# Patient Record
Sex: Female | Born: 1980 | Race: Black or African American | Hispanic: No | Marital: Single | Smoking: Current some day smoker
Health system: Southern US, Community
[De-identification: ages and names within clinical notes are randomized; demographics above are authoritative.]

## PROBLEM LIST (undated history)

## (undated) DIAGNOSIS — R011 Cardiac murmur, unspecified: Secondary | ICD-10-CM

## (undated) DIAGNOSIS — E039 Hypothyroidism, unspecified: Secondary | ICD-10-CM

## (undated) DIAGNOSIS — G5621 Lesion of ulnar nerve, right upper limb: Secondary | ICD-10-CM

## (undated) HISTORY — PX: WISDOM TOOTH EXTRACTION: SHX21

---

## 2000-02-22 ENCOUNTER — Other Ambulatory Visit: Admission: RE | Admit: 2000-02-22 | Discharge: 2000-02-22 | Payer: Self-pay | Admitting: Obstetrics and Gynecology

## 2000-03-07 ENCOUNTER — Inpatient Hospital Stay: Admission: AD | Admit: 2000-03-07 | Discharge: 2000-03-07 | Payer: Self-pay | Admitting: Obstetrics and Gynecology

## 2000-06-13 ENCOUNTER — Inpatient Hospital Stay (HOSPITAL_COMMUNITY): Admission: AD | Admit: 2000-06-13 | Discharge: 2000-07-22 | Payer: Self-pay | Admitting: Obstetrics & Gynecology

## 2000-06-13 ENCOUNTER — Encounter (INDEPENDENT_AMBULATORY_CARE_PROVIDER_SITE_OTHER): Payer: Self-pay | Admitting: Specialist

## 2000-06-14 ENCOUNTER — Encounter: Payer: Self-pay | Admitting: Obstetrics and Gynecology

## 2000-06-27 ENCOUNTER — Encounter: Payer: Self-pay | Admitting: Obstetrics and Gynecology

## 2001-10-16 ENCOUNTER — Encounter: Payer: Self-pay | Admitting: Emergency Medicine

## 2001-10-16 ENCOUNTER — Emergency Department (HOSPITAL_COMMUNITY): Admission: EM | Admit: 2001-10-16 | Discharge: 2001-10-16 | Payer: Self-pay | Admitting: Emergency Medicine

## 2004-01-02 ENCOUNTER — Inpatient Hospital Stay (HOSPITAL_COMMUNITY): Admission: AD | Admit: 2004-01-02 | Discharge: 2004-01-02 | Payer: Self-pay

## 2004-01-15 ENCOUNTER — Encounter (HOSPITAL_COMMUNITY): Admission: RE | Admit: 2004-01-15 | Discharge: 2004-04-14 | Payer: Self-pay | Admitting: Internal Medicine

## 2004-03-13 ENCOUNTER — Ambulatory Visit (HOSPITAL_COMMUNITY): Admission: RE | Admit: 2004-03-13 | Discharge: 2004-03-13 | Payer: Self-pay | Admitting: Internal Medicine

## 2004-03-20 ENCOUNTER — Encounter (INDEPENDENT_AMBULATORY_CARE_PROVIDER_SITE_OTHER): Payer: Self-pay | Admitting: Specialist

## 2004-03-20 ENCOUNTER — Ambulatory Visit (HOSPITAL_COMMUNITY): Admission: RE | Admit: 2004-03-20 | Discharge: 2004-03-20 | Payer: Self-pay | Admitting: Internal Medicine

## 2004-05-16 ENCOUNTER — Emergency Department (HOSPITAL_COMMUNITY): Admission: EM | Admit: 2004-05-16 | Discharge: 2004-05-16 | Payer: Self-pay | Admitting: Emergency Medicine

## 2005-12-24 ENCOUNTER — Emergency Department (HOSPITAL_COMMUNITY): Admission: EM | Admit: 2005-12-24 | Discharge: 2005-12-24 | Payer: Self-pay | Admitting: Emergency Medicine

## 2006-02-16 IMAGING — US US SOFT TISSUE HEAD/NECK
1 series · 13 of 25 positions shown · non-contrast
Comparison: none

CLINICAL DATA: Lt thyroid nodule; this has been demonstrated to represent a cold nodule on thyroid scintigraphy dated 01/16/04; ultrasound is now performed
 ULTRASOUND OF THE SOFT TISSUES OF THE NECK:
 Examination of the thyroid gland was performed.  The left lobe is relatively enlarged compared to the right and measures 5.4 x 2.3 x 3.1 cm.  The right lobe measures 4.1 x 1.3 x 0.9 cm.  Thyroid isthmus is of normal thickness in the midline measuring 1.6 mm.  
 There is a dominant, ovoid nodule within the mid to lower portion of the left lobe measuring 3.3 x 1.5 x 2.4 cm.  This corresponds to cold defect by ultrasound.  Just inferior to this is a second small nodule at the tip of the inferior lobe measuring 1.4 x 0.9 x 1.6 cm.  The upper pole of the left lobe demonstrates a focal benign appearing cyst measuring 6 x 3 x 5 mm.  
 Right lobe demonstrates two small areas of nodularity which are partially cystic.  There is a tiny 4 x 3 x 3 mm nodule in the upper pole and a 10 x 7 x 7 mm nodule in the mid portion of the right lobe.

[Series 1: unknown · 0.07mm/px · 13 of 55 slices shown]
[im 1/55]
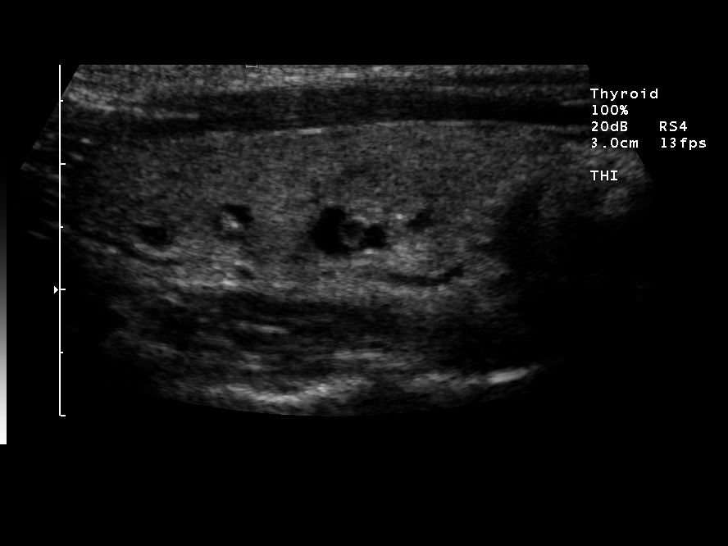
[im 5/55]
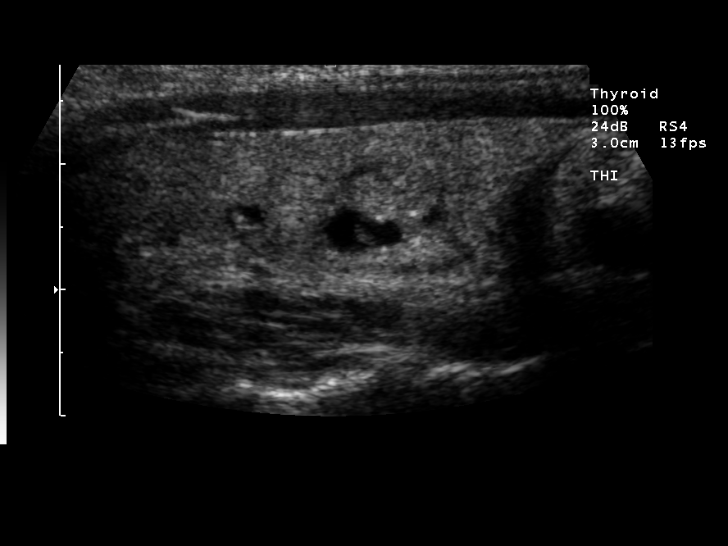
[im 10/55]
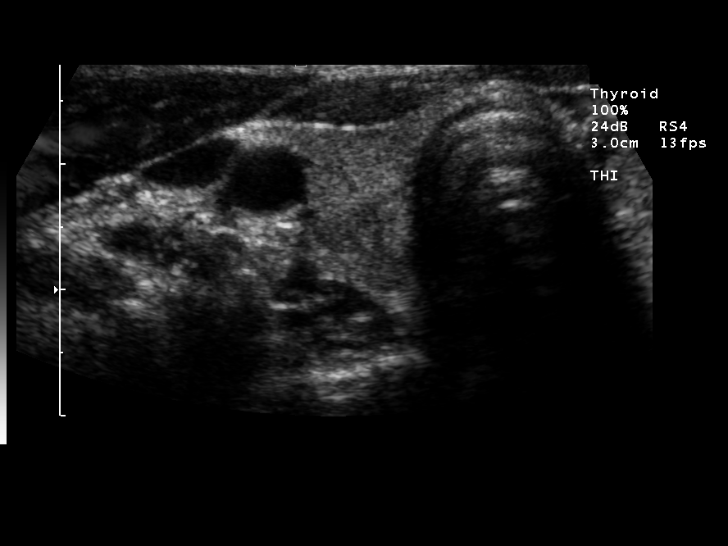
[im 14/55]
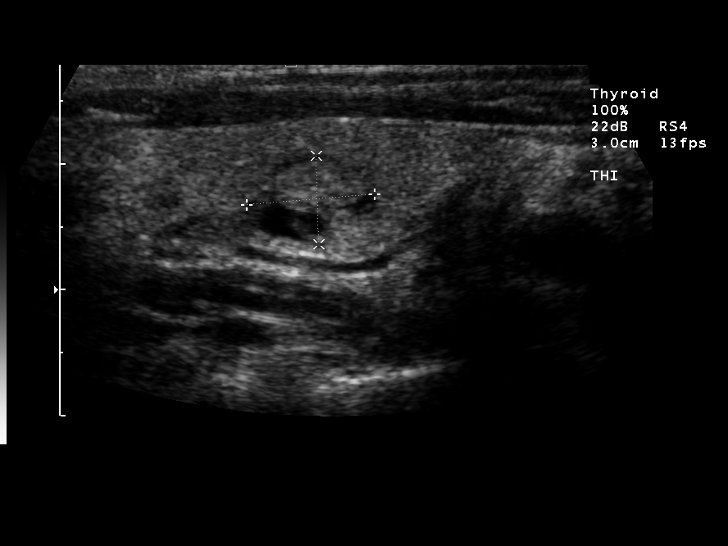
[im 19/55]
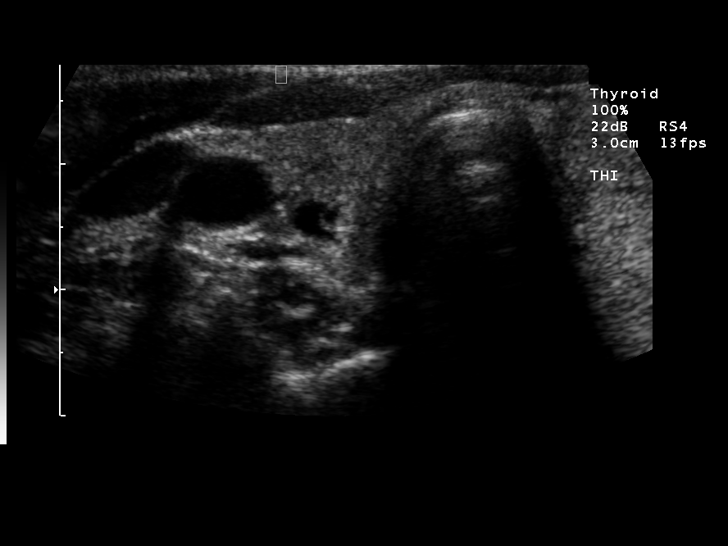
[im 23/55]
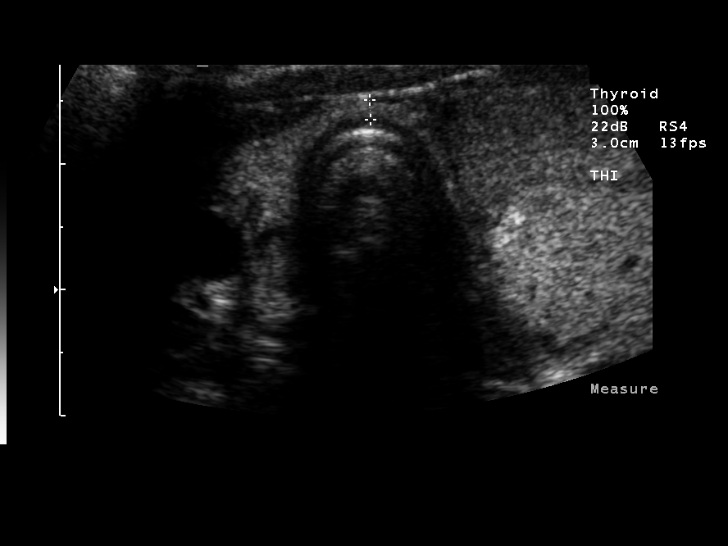
[im 28/55]
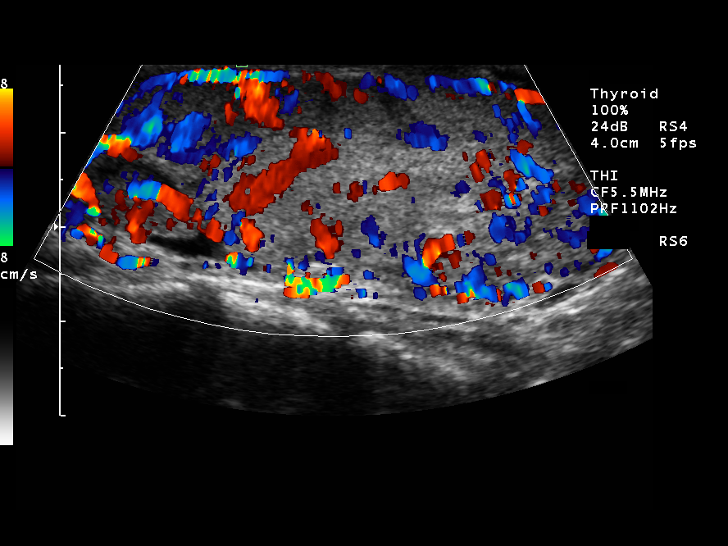
[im 32/55]
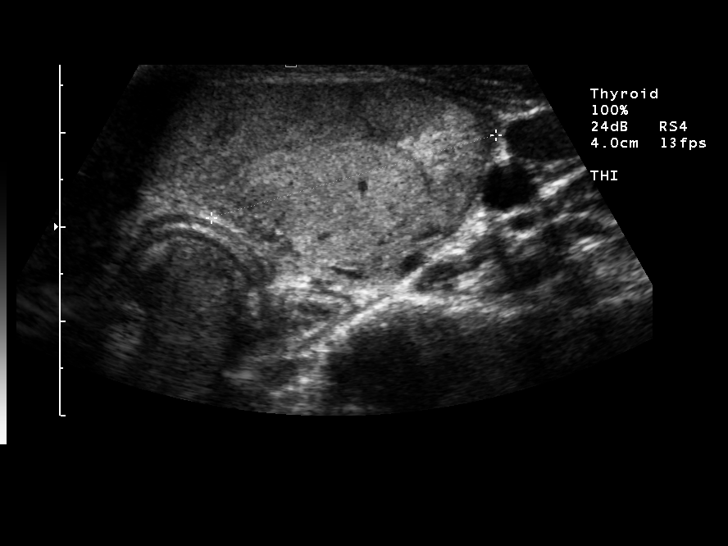
[im 37/55]
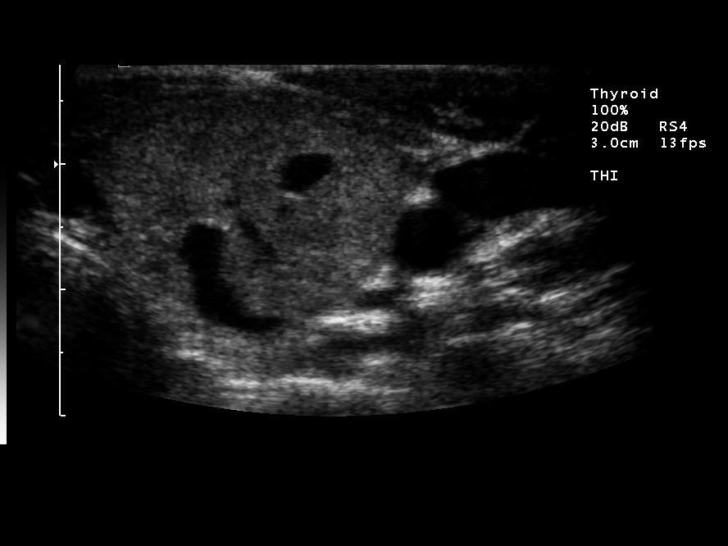
[im 41/55]
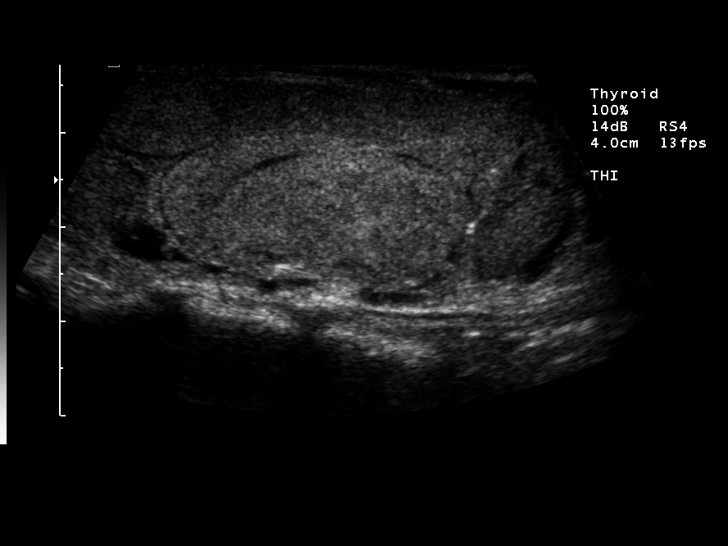
[im 46/55]
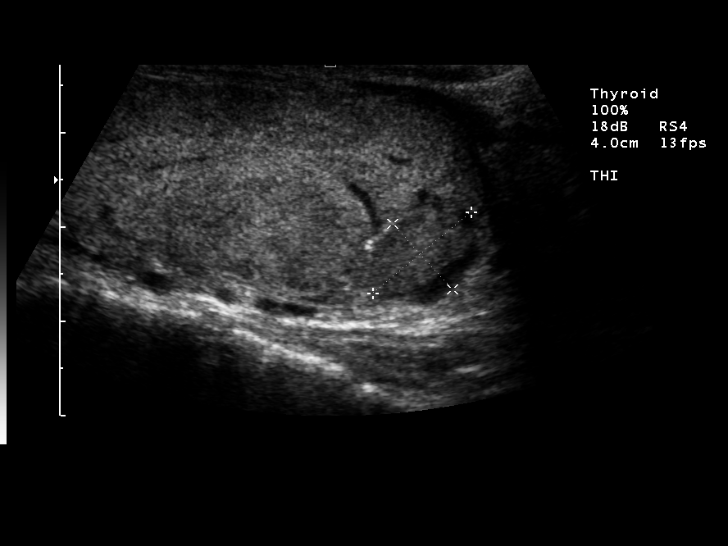
[im 50/55]
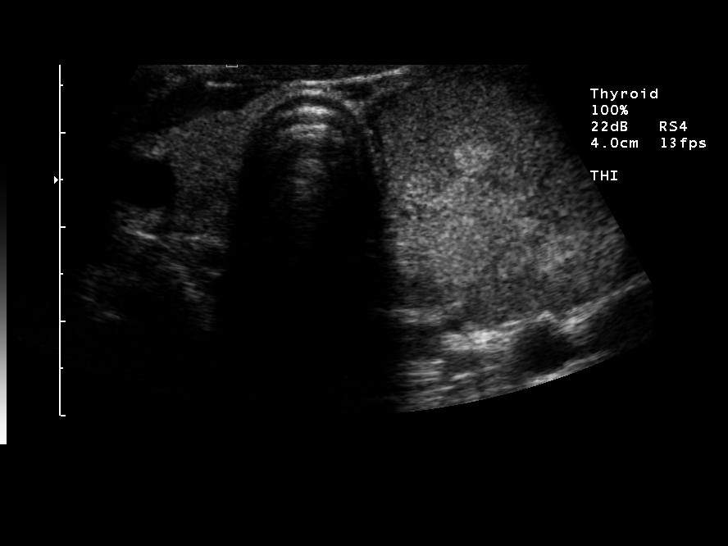
[im 55/55]
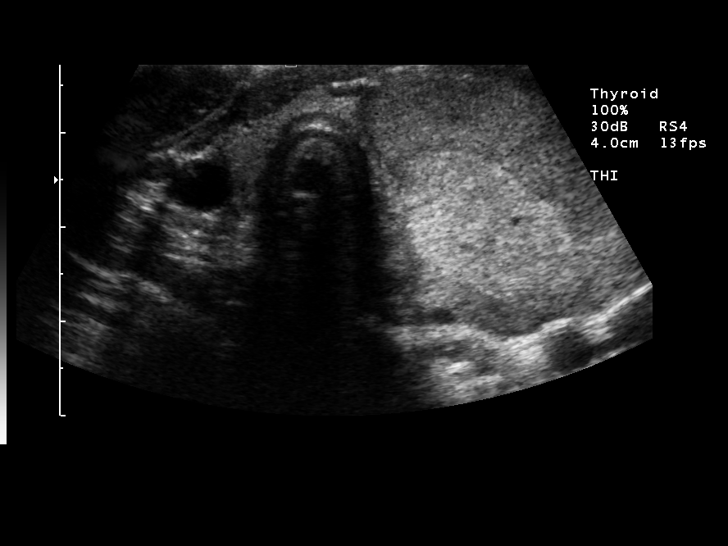

[13 of 25 positions shown; findings below may reference images not displayed]

IMPRESSION: Bilateral thyroid nodules as described.  The dominant nodule within the mid to inferior thyroid measures 3.3 x 1.5 x 2.4 cm and corresponds to the cold defect seen on nuclear medicine study.  Given slightly increased chance of malignancy with a cold defect, ultrasound guided biopsy of this nodule may be warranted.

## 2006-12-06 ENCOUNTER — Encounter (INDEPENDENT_AMBULATORY_CARE_PROVIDER_SITE_OTHER): Payer: Self-pay | Admitting: Interventional Radiology

## 2006-12-06 ENCOUNTER — Other Ambulatory Visit: Admission: RE | Admit: 2006-12-06 | Discharge: 2006-12-06 | Payer: Self-pay | Admitting: Physician Assistant

## 2006-12-06 ENCOUNTER — Encounter: Admission: RE | Admit: 2006-12-06 | Discharge: 2006-12-06 | Payer: Self-pay | Admitting: Internal Medicine

## 2007-12-07 ENCOUNTER — Encounter: Admission: RE | Admit: 2007-12-07 | Discharge: 2007-12-07 | Payer: Self-pay | Admitting: Internal Medicine

## 2007-12-11 ENCOUNTER — Encounter: Admission: RE | Admit: 2007-12-11 | Discharge: 2008-01-04 | Payer: Self-pay | Admitting: Orthopedic Surgery

## 2008-01-31 ENCOUNTER — Encounter (HOSPITAL_BASED_OUTPATIENT_CLINIC_OR_DEPARTMENT_OTHER): Payer: Self-pay | Admitting: General Surgery

## 2008-01-31 ENCOUNTER — Ambulatory Visit (HOSPITAL_COMMUNITY): Admission: RE | Admit: 2008-01-31 | Discharge: 2008-02-01 | Payer: Self-pay | Admitting: General Surgery

## 2008-01-31 HISTORY — PX: TOTAL THYROIDECTOMY: SHX2547

## 2010-06-09 ENCOUNTER — Emergency Department (HOSPITAL_COMMUNITY)
Admission: EM | Admit: 2010-06-09 | Discharge: 2010-06-09 | Disposition: A | Discharge status: Home / Self Care | Payer: BC Managed Care – PPO | Attending: Emergency Medicine | Admitting: Emergency Medicine

## 2010-06-09 ENCOUNTER — Emergency Department (HOSPITAL_COMMUNITY): Payer: BC Managed Care – PPO

## 2010-06-09 DIAGNOSIS — R51 Headache: Secondary | ICD-10-CM | POA: Insufficient documentation

## 2010-06-09 DIAGNOSIS — F3289 Other specified depressive episodes: Secondary | ICD-10-CM | POA: Insufficient documentation

## 2010-06-09 DIAGNOSIS — E039 Hypothyroidism, unspecified: Secondary | ICD-10-CM | POA: Insufficient documentation

## 2010-06-09 DIAGNOSIS — H532 Diplopia: Secondary | ICD-10-CM | POA: Insufficient documentation

## 2010-06-09 DIAGNOSIS — F329 Major depressive disorder, single episode, unspecified: Secondary | ICD-10-CM | POA: Insufficient documentation

## 2010-06-09 LAB — COMPREHENSIVE METABOLIC PANEL
AST: 19 U/L (ref 0–37)
BUN: 11 mg/dL (ref 6–23)
CO2: 26 mEq/L (ref 19–32)
Chloride: 101 mEq/L (ref 96–112)
Creatinine, Ser: 0.81 mg/dL (ref 0.4–1.2)
GFR calc Af Amer: >60 mL/min (ref >60)
GFR calc non Af Amer: >60 mL/min (ref >60)
Total Bilirubin: 0.8 mg/dL (ref 0.3–1.2)

## 2010-06-09 LAB — DIFFERENTIAL
Basophils Absolute: 0 10*3/uL (ref 0.0–0.1)
Basophils Relative: 0 % (ref 0–1)
Eosinophils Relative: 2 % (ref 0–5)
Lymphocytes Relative: 25 % (ref 12–46)

## 2010-06-09 LAB — CBC
HCT: 39.4 % (ref 36.0–46.0)
Platelets: 274 10*3/uL (ref 150–400)
RDW: 12.9 % (ref 11.5–15.5)
WBC: 9.2 10*3/uL (ref 4.0–10.5)

## 2010-06-09 LAB — T4: T4, Total: 6.2 ug/dL (ref 5.0–12.5)

## 2010-06-09 LAB — TSH: TSH: 34.509 u[IU]/mL — ABNORMAL HIGH (ref 0.350–4.500)

## 2010-08-11 NOTE — Op Note (Signed)
NAMEAROURA, VASUDEVAN NO.:  [id number]   MEDICAL RECORD NO.:  [id number]          PATIENT TYPE:  OIB   LOCATION:  5121                         FACILITY:  [facility]   PHYSICIAN:  Leonie Man, M.D.   DATE OF BIRTH:  10-Sep-1980   DATE OF PROCEDURE:  01/31/2008  DATE OF DISCHARGE:  02/01/2008                               OPERATIVE REPORT   PREOPERATIVE DIAGNOSIS:  Multinodular goiter with compression symptoms.   POSTOPERATIVE DIAGNOSIS:  Multinodular goiter with compression symptoms.   PROCEDURE:  Total thyroidectomy.   SURGEON:  Leonie Man, MD   ASSISTANT:  Currie Paris, MD   ANESTHESIA:  General.   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  She does have  some breathing problems from time to time.  She was placed on  suppression therapy with 50 mcg of Synthroid, but has been unable to  tolerate this due to symptoms of anxiety and irritability and  significant mood changes and palpitations.  She has discontinued the  suppression therapy and is referred for surgical treatment of her  thyroid problem.   The risks and potential benefits of thyroid surgery have been fully  discussed with the patient.  She understands the additional risks of  permanent hypoparathyroidism and recurrent laryngeal nerve injury.  She  understands and gives her consent to surgery.   PROCEDURE:  The patient was positioned supinely.  Following the  induction of general endotracheal anesthesia, the head and neck are  hyperextended with a wedge placed between the shoulders.  The neck and  anterior chest were prepped and draped to be included in the sterile  operative field.  Standard presurgical precautions identifying the  patient as Dermabond.  Procedure to be done as total thyroidectomy.  Appropriate preoperative antibiotics  given.  No heavy problems noted.   A transverse collar incision made approximately two fingerbreadths above  the sternal notch, deepened through the skin and subcutaneous tissues  and across the platysma muscle.  A superior myocutaneous flap was raised  up to the thyroid cartilage and an inferior myocutaneous flap taken down  to the sternal notch.  The strap muscles are then opened up in the  midline carrying the dissection of thyroid gland first over to the left  side dissecting up to the superior pole where the superior pole vessels  were isolated, doubly clipped and transected with a harmonic scalpel.  The left upper pole parathyroid was identified and protected throughout  the course of dissection and care was taken to avoid the recurrent  laryngeal nerve on this side, which was also identified.  The lower pole  parathyroid was also identified.  This inferior thyroid vessels were  taken between the clips and transected.  Thyroid gland was then  dissected away from the trachea carrying this across to midline and  taking with it the pyramidal lobe.  The left sulcus of the neck was then  checked for hemostasis and packed  with a sponge while attention was then  turned to the right side of the neck.  Similarly, the sternohyoid and  sternothyroid muscles were dissected free from off of the thyroid gland,  carrying the dissection laterally over to the right side.  The middle  thyroid vein was taken between the clips and the thyroid mobilized  carrying the dissection up to the superior pole where the superior  vessels were isolated and doubly clipped and transected with a harmonic  scalpel.  With the use of the harmonic scalpel, the thigh was then  dissected free.  The superior and inferior parathyroid glands as well as  the recurrent laryngeal nerves were protected throughout the course of  dissection.  The thyroid gland and the inferior pole vessels were taken  between the clips and  transected with a harmonic scalpel.  The remaining  attachments of the ligament of Allyson Sabal were then taken down removing the  thyroid in its entirety and forwarding this for pathologic evaluation.  All areas within both the right and left neck sulcus were checked for  hemostasis and additional bleeding points treated with either small  clips or with electrocautery.  The wound was thoroughly irrigated with  normal.  With normal saline.  Surgicel pads were placed over all areas  of dissection for additional hemostasis.  Sponge, instrument, and sharp  counts were then verified.  The midline strap muscles were closed with  interrupted 3-0 Vicryl sutures.  The flaps were checked for hemostasis,  noted to be dry.  The platysma muscles were closed with interrupted 3-0  Vicryl sutures.  The skin was closed with a 5-0 Monocryl suture and  reinforced with Steri-Strips.  Sterile dressing was placed on the wound.  The anesthetic reversed and the patient removed from the operating room  to the recovery room in stable condition.  She tolerated the procedure  well.      Leonie Man, M.D.  Electronically Signed     PB/MEDQ  D:  02/13/2008  T:  02/13/2008  Job:  [id number]   cc:   Dorisann Frames, M.D.  [facility]

## 2010-08-14 NOTE — Op Note (Signed)
[facility]  Patient:    Jeanne Schmidt, Jeanne Schmidt                           MRN: [id number] Proc. Date: 07/16/00 Adm. Date:  [id number] Attending:  Marcelle Overlie                           Operative Report  PATIENTS AGE:                30.  PREOPERATIVE DIAGNOSES:       1. A 34 to 35 week intrauterine pregnancy                               2. Prolonged premature rupture of membranes.                               3. Failed induction.                               4. Nonreassuring fetal heart tracing.  POSTOPERATIVE DIAGNOSES:      1. A 34 to 35 week intrauterine pregnancy                               2. Prolonged premature rupture of membranes.                               3. Failed induction.                               4. Nonreassuring fetal heart tracing.                               5. Delivery of a 5 pound 12 ounce female                                  infant with Apgars 8 and 9.  OPERATION:                    Primary low transverse cesarean section.  SURGEON:                      Gerrit Friends. Aldona Bar, M.D.  ASSISTANT:  ANESTHESIA:                   Epidural anesthesia.  ESTIMATED BLOOD LOSS:         500 cc.  INDICATIONS:                  This 30 year old primigravida was admitted on June 13, 2000, at almost [redacted] weeks gestation with preterm premature rupture of membranes.  She was treated very aggressively initially with very close observation afterwards and did amazingly well.  On the evening of July 14, 2000, [name]. Dareen Piano, M.D., collected a vaginal pool of amniotic fluid and submitted it to the lab for Amniostat which was markedly positive. Induction therefore was begun on the  morning of July 15, 2000.  Her cervix at that time was 3 cm dilated, 80% effaced with vertex at +1 station.  She was maintained on Pitocin during the entire day of July 15, 2000, with very little change to her cervix.  An intrauterine pressure catheter had  been placed and a good contraction pattern was noted.  At approximately 8 or 9 p.m. on the evening of July 15, 2000, the Pitocin was decreased but continuously maintained through the night.  It was increased again on the morning of July 16, 2000, and an epidural was placed as well.  At that time she had progressed to 4 cm and almost 100% effaced.  Unfortunately, although some change in the cervix was noted initially, there seemed to be increased thickening over time with no further change beyond 4 cm in spite of great labor as documented by her intrauterine pressure catheter.  Indeed, another worrisome factor was the beginning of a flatter appearing fetal heart tracing. The decision was made to proceed to delivery by low transverse cesarean section.  DESCRIPTION OF PROCEDURE:     The patient was taken to the operating room where her epidural was augmented.  She was placed on the operating table, slightly tilted to the left in the supine position.  A Foley catheter had already been inserted.  Once her epidural was augmented, she was prepped and draped adequately.  Anesthetic levels were checked and found to be adequate. At this time the procedure was begun.  A Pfannenstiel incision was made with minimal difficulty, dissected down sharply to and through the fascia in low transverse fashion without difficulty.  Subfascial space was created inferiorly and superiorly and muscles separated in the midline.  Peritoneum identified and entered appropriately, with care taken to avoid the bowel superiorly and the bladder inferiorly.  At this time the vesicouterine peritoneum was identified, incised in low transverse fashion and pushed off the lower uterine segment with ease.  Sharp incision into the uterus with Metzenbaum scissors was then carried out in the low transverse fashion and extended with the fingers. Amniotic fluid appeared and was clear.  Thereafter, with minimal difficulty, a viable  female infant was delivered.  There was a nuchal cord x 1.  After the infant was delivered from the vertex position, the cord was clamped and cut and the infant was passed off to the awaiting team. The baby was subsequently taken to the nursery in good condition with Apgars of 8 and 9 and was found to weigh 5 pounds 12 ounces.  It was a female.  The baby seemed to have a dislocated left knee, probably related to in utero positioning associated with oligohydramnios - preterm premature rupture of membranes.  After the placenta was delivered intact, after the cord bloods were collected, the uterus was exteriorized. The uterus was rendered free of any remaining products of conception.  Good uterine contractility was afforded with slowly given intravenous Pitocin and manual stimulation.  Closure of the uterus was then carried out using a single layer of #1 Vicryl in a running locking fashion with one reinforced #1 Vicryl in a left angle. At this time hemostasis was adequate in the uterine incision. Tubes and ovaries appeared normal. The uterus was well contracted.  The uterus was then replaced into the abdomen after the abdomen was washed of all blood and clot.  Closure of the abdomen at this time was begun after the abdomen was noted to be free of any foreign bodies or  instruments, all counts were correct. The abdominal peritoneum was closed with 0 Vicryl in a running fashion and muscles secured with same. Assured of good subfascial hemostasis, the fascia was then reapproximated with 0 Vicryl from angle to midline bilaterally. Subcutaneous tissue was then rendered hemostatic and staples were then used to close the skin.  A sterile pressure dressing was applied and the patient, at this time, was transported to the recovery room in satisfactory condition having tolerated the procedure well.  The patient had good urine output during the procedure.  At the conclusion of the procedure both she and  the baby were doing well in their respective recovery areas.  The estimated blood loss was 500 cc.  All counts were correct x 2. DD:  07/16/00 TD:  07/18/00 Job: [id number]

## 2010-08-14 NOTE — Discharge Summary (Signed)
[facility]  Patient:    Jeanne Schmidt, Jeanne Schmidt                           MRN: [id number] Adm. Date:  [id number] Disc. Date: 07/22/00 Attending:  Marcelle Overlie                           Discharge Summary  FINAL DIAGNOSES:              Preterm premature rupture of membranes, delivery of healthy female infant, failed induction.  SECONDARY DIAGNOSES:          None.  PROCEDURE:                    Primary low transverse cesarean section.  COMPLICATIONS:                None.  CONDITION ON DISCHARGE:       Improved.                                This is a 30 year old gravida 2, para 0 who was admitted at [redacted] weeks gestation with preterm premature rupture of membranes. Patient was admitted to the hospital and started on magnesium.  Was given betamethasone to mature the fetal lungs and Unasyn to prevent chorioamnionitis.  Patient was observed in the hospital and over the course of a week she did not go into preterm labor.  Her vital signs remained stable with no evidence of intrauterine infection.  She was maintained on oral antibiotics and continued at hospital observation.  The patient had absolutely no complications in the hospital and no signs of developing chorioamnionitis or preterm labor.  When the patient reached the [redacted] weeks gestation the decision was made to collect amniotic fluid from the vagina and check for fetal lung maturity.  This was done on April 18 and it came back positive for PG which indicated fetal lung maturity.  Based upon this and the risks of further observation of cord prolapse or chorioamnionitis, the decision was made to proceed with induction.  Unfortunately, the induction did not go smoothly and the patient developed some nonreassuring changes of the heart rate pattern.  For this reason the obstetrician on-call, Dr. Annamaria Helling, made the decision to proceed with low transverse cesarean section which was done on April 20.  The  cesarean went without complications with the delivery of a 5 pound 12 ounce female infant with Apgar scores of 8 and 9.  Postoperatively the patient developed a low grade infection which was treated with gentamycin and clindamycin.  On the morning of the fifth postoperative day the patient had been afebrile for 24 hours.  Her pulse was 77.  White count was 7800.  She was therefore felt to be ready for discharge.  She was also ambulating well, voicing no complaints, voiding, eating, and passing gas without difficulty. She was therefore discharged on a regular diet, told to limit her activity. She was given Tylox 20 tablets to take one to two q.4h. for pain.  Was asked to start out with over-the-counter medications and take the Tylox only if the over-the-counter medications failed to relieve her discomfort.  In addition, she was given Augmentin 875 mg to take b.i.d. for an additional three days and asked to return to the office in four weeks  for followup evaluation.  LABORATORIES:                 White count 7800 on discharge, hemoglobin 7.4. DD:  07/22/00 TD:  07/22/00 Job: [id number]

## 2010-12-29 LAB — COMPREHENSIVE METABOLIC PANEL
ALT: 14
Chloride: 101
GFR calc Af Amer: >60
GFR calc non Af Amer: >60
Potassium: 3.6
Sodium: 138
Total Protein: 7.9

## 2010-12-29 LAB — PROTIME-INR
INR: 1
Prothrombin Time: 13.7

## 2010-12-29 LAB — DIFFERENTIAL
Eosinophils Absolute: 0.1
Lymphocytes Relative: 29
Lymphs Abs: 2.4
Neutro Abs: 5.3
Neutrophils Relative %: 63

## 2010-12-29 LAB — CBC
HCT: 36.7
Platelets: 294
WBC: 8.4

## 2010-12-29 LAB — CALCIUM: Calcium: 8.5

## 2012-07-17 ENCOUNTER — Other Ambulatory Visit: Payer: Self-pay | Admitting: Registered Nurse

## 2012-07-17 ENCOUNTER — Other Ambulatory Visit (HOSPITAL_COMMUNITY)
Admission: RE | Admit: 2012-07-17 | Discharge: 2012-07-17 | Disposition: A | Discharge status: Home / Self Care | Payer: BC Managed Care – PPO | Source: Ambulatory Visit | Attending: Internal Medicine | Admitting: Internal Medicine

## 2012-07-17 DIAGNOSIS — Z01419 Encounter for gynecological examination (general) (routine) without abnormal findings: Secondary | ICD-10-CM | POA: Insufficient documentation

## 2014-04-27 ENCOUNTER — Encounter (HOSPITAL_COMMUNITY): Payer: Self-pay | Admitting: Emergency Medicine

## 2014-04-27 ENCOUNTER — Emergency Department (HOSPITAL_COMMUNITY)
Admission: EM | Admit: 2014-04-27 | Discharge: 2014-04-27 | Disposition: A | Discharge status: Home / Self Care | Payer: BC Managed Care – PPO | Attending: Emergency Medicine | Admitting: Emergency Medicine

## 2014-04-27 DIAGNOSIS — S199XXA Unspecified injury of neck, initial encounter: Secondary | ICD-10-CM | POA: Insufficient documentation

## 2014-04-27 DIAGNOSIS — Y998 Other external cause status: Secondary | ICD-10-CM | POA: Diagnosis not present

## 2014-04-27 DIAGNOSIS — Y92410 Unspecified street and highway as the place of occurrence of the external cause: Secondary | ICD-10-CM | POA: Diagnosis not present

## 2014-04-27 DIAGNOSIS — Y9389 Activity, other specified: Secondary | ICD-10-CM | POA: Diagnosis not present

## 2014-04-27 DIAGNOSIS — S3992XA Unspecified injury of lower back, initial encounter: Secondary | ICD-10-CM | POA: Insufficient documentation

## 2014-04-27 DIAGNOSIS — Z720 Tobacco use: Secondary | ICD-10-CM | POA: Insufficient documentation
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
## 2014-04-27 NOTE — Discharge Instructions (Signed)
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
## 2015-05-10 ENCOUNTER — Emergency Department (HOSPITAL_COMMUNITY): Payer: BC Managed Care – PPO

## 2015-05-10 ENCOUNTER — Encounter (HOSPITAL_COMMUNITY): Payer: Self-pay | Admitting: Emergency Medicine

## 2015-05-10 ENCOUNTER — Emergency Department (HOSPITAL_COMMUNITY)
Admission: EM | Admit: 2015-05-10 | Discharge: 2015-05-10 | Disposition: A | Discharge status: Home / Self Care | Payer: BC Managed Care – PPO | Attending: Emergency Medicine | Admitting: Emergency Medicine

## 2015-05-10 DIAGNOSIS — Y9389 Activity, other specified: Secondary | ICD-10-CM | POA: Diagnosis not present

## 2015-05-10 DIAGNOSIS — Y92410 Unspecified street and highway as the place of occurrence of the external cause: Secondary | ICD-10-CM | POA: Diagnosis not present

## 2015-05-10 DIAGNOSIS — Y998 Other external cause status: Secondary | ICD-10-CM | POA: Diagnosis not present

## 2015-05-10 DIAGNOSIS — F17200 Nicotine dependence, unspecified, uncomplicated: Secondary | ICD-10-CM | POA: Diagnosis not present

## 2015-05-10 DIAGNOSIS — M542 Cervicalgia: Secondary | ICD-10-CM

## 2015-05-10 DIAGNOSIS — S199XXA Unspecified injury of neck, initial encounter: Secondary | ICD-10-CM | POA: Diagnosis present

## 2015-05-10 MED ORDER — IBUPROFEN 800 MG PO TABS
800.0000 mg | ORAL_TABLET | Freq: Once | ORAL | Status: AC
  Administered 2015-05-10: 800 mg via ORAL
  Filled 2015-05-10: qty 1

## 2015-05-10 MED ORDER — ACETAMINOPHEN 325 MG PO TABS
650.0000 mg | ORAL_TABLET | Freq: Once | ORAL | Status: AC
  Administered 2015-05-10: 650 mg via ORAL
  Filled 2015-05-10: qty 2

## 2015-05-10 MED ORDER — CYCLOBENZAPRINE HCL 5 MG PO TABS: 10.0000 mg | ORAL_TABLET | Freq: Every day | ORAL | Status: DC
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
## 2015-05-10 NOTE — ED Notes (Signed)
Patient is alert and oriented x3.  She was given DC instructions and follow up visit instructions.  Patient gave verbal understanding. She was DC ambulatory under her own power to home.  V/S stable.  He was not showing any signs of distress on DC 

## 2015-05-10 NOTE — Discharge Instructions (Signed)
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
## 2015-05-18 ENCOUNTER — Emergency Department (HOSPITAL_COMMUNITY)
Admission: EM | Admit: 2015-05-18 | Discharge: 2015-05-18 | Disposition: A | Discharge status: Home / Self Care | Payer: No Typology Code available for payment source | Attending: Emergency Medicine | Admitting: Emergency Medicine

## 2015-05-18 DIAGNOSIS — M546 Pain in thoracic spine: Secondary | ICD-10-CM | POA: Diagnosis present

## 2015-05-18 DIAGNOSIS — M62838 Other muscle spasm: Secondary | ICD-10-CM | POA: Diagnosis not present

## 2015-05-18 DIAGNOSIS — Z79899 Other long term (current) drug therapy: Secondary | ICD-10-CM | POA: Insufficient documentation

## 2015-05-18 DIAGNOSIS — F17200 Nicotine dependence, unspecified, uncomplicated: Secondary | ICD-10-CM | POA: Insufficient documentation

## 2015-05-18 DIAGNOSIS — T148XXA Other injury of unspecified body region, initial encounter: Secondary | ICD-10-CM

## 2015-05-18 DIAGNOSIS — T148 Other injury of unspecified body region: Secondary | ICD-10-CM | POA: Diagnosis not present

## 2015-05-18 MED ORDER — BUPIVACAINE-EPINEPHRINE (PF) 0.5% -1:200000 IJ SOLN: 1.8000 mL | Freq: Once | INTRAMUSCULAR | Status: DC

## 2015-05-18 MED ORDER — DIAZEPAM 2 MG PO TABS: 2.0000 mg | ORAL_TABLET | Freq: Two times a day (BID) | ORAL | Status: DC

## 2015-05-18 MED ORDER — DIAZEPAM 5 MG PO TABS: 5.0000 mg | ORAL_TABLET | Freq: Once | ORAL | Status: DC

## 2015-05-18 MED ORDER — NAPROXEN 500 MG PO TABS: 500.0000 mg | ORAL_TABLET | Freq: Two times a day (BID) | ORAL | Status: DC

## 2015-05-18 MED ORDER — HYDROCODONE-ACETAMINOPHEN 5-325 MG PO TABS
1.0000 | ORAL_TABLET | Freq: Once | ORAL | Status: DC
Start: 2015-05-18 — End: 2015-05-18
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
## 2015-05-18 NOTE — Discharge Instructions (Signed)
Your medication as prescribed. I recommend applying ice or heat to affected area for 1520 minutes 3-4 times daily for pain relief. Refrain from doing any heavy lifting, exercising or repetitive movements that exacerbate your symptoms for the next few days. Follow-up with your primary care provider in one week as needed. Return to the emergency department if symptoms worsen or new onset of fever, numbness, tingling, groin anesthesia, loss of bowel or bladder, weakness.

## 2015-05-18 NOTE — ED Notes (Signed)
Patient was alert, oriented and stable upon discharge. RN went over AVS and patient had no further questions.  
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
## 2016-11-25 ENCOUNTER — Encounter (HOSPITAL_COMMUNITY): Payer: Self-pay | Admitting: Emergency Medicine

## 2016-11-25 ENCOUNTER — Emergency Department (HOSPITAL_COMMUNITY): Payer: BC Managed Care – PPO

## 2016-11-25 ENCOUNTER — Emergency Department (HOSPITAL_COMMUNITY)
Admission: EM | Admit: 2016-11-25 | Discharge: 2016-11-25 | Disposition: A | Discharge status: Home / Self Care | Payer: BC Managed Care – PPO | Attending: Emergency Medicine | Admitting: Emergency Medicine

## 2016-11-25 DIAGNOSIS — Z79899 Other long term (current) drug therapy: Secondary | ICD-10-CM | POA: Insufficient documentation

## 2016-11-25 DIAGNOSIS — S199XXA Unspecified injury of neck, initial encounter: Secondary | ICD-10-CM | POA: Diagnosis present

## 2016-11-25 DIAGNOSIS — Y999 Unspecified external cause status: Secondary | ICD-10-CM | POA: Insufficient documentation

## 2016-11-25 DIAGNOSIS — F17210 Nicotine dependence, cigarettes, uncomplicated: Secondary | ICD-10-CM | POA: Insufficient documentation

## 2016-11-25 DIAGNOSIS — S161XXA Strain of muscle, fascia and tendon at neck level, initial encounter: Secondary | ICD-10-CM | POA: Diagnosis not present

## 2016-11-25 DIAGNOSIS — Y9389 Activity, other specified: Secondary | ICD-10-CM | POA: Insufficient documentation

## 2016-11-25 DIAGNOSIS — Y92410 Unspecified street and highway as the place of occurrence of the external cause: Secondary | ICD-10-CM | POA: Insufficient documentation

## 2016-11-25 MED ORDER — NAPROXEN 375 MG PO TABS: 375.0000 mg | ORAL_TABLET | Freq: Two times a day (BID) | ORAL | 0 refills | Status: DC

## 2016-11-25 MED ORDER — CYCLOBENZAPRINE HCL 10 MG PO TABS
5.0000 mg | ORAL_TABLET | Freq: Once | ORAL | Status: AC
  Administered 2016-11-25: 5 mg via ORAL
  Filled 2016-11-25: qty 1

## 2016-11-25 MED ORDER — CYCLOBENZAPRINE HCL 5 MG PO TABS: 5.0000 mg | ORAL_TABLET | Freq: Three times a day (TID) | ORAL | 0 refills | Status: DC | PRN

## 2016-11-25 NOTE — Discharge Instructions (Signed)
Do not drive while taking the muscle relaxant as it will make you sleepy. °

## 2016-11-25 NOTE — ED Notes (Signed)
Patient in radiology
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
## 2016-11-25 NOTE — ED Notes (Signed)
Gave report to Lisa, RN

## 2017-10-27 DIAGNOSIS — G5621 Lesion of ulnar nerve, right upper limb: Secondary | ICD-10-CM

## 2017-10-27 HISTORY — DX: Lesion of ulnar nerve, right upper limb: G56.21

## 2017-11-11 ENCOUNTER — Other Ambulatory Visit: Payer: Self-pay | Admitting: Orthopedic Surgery

## 2017-11-17 ENCOUNTER — Encounter (HOSPITAL_BASED_OUTPATIENT_CLINIC_OR_DEPARTMENT_OTHER): Payer: Self-pay | Admitting: *Deleted

## 2017-11-17 ENCOUNTER — Other Ambulatory Visit: Payer: Self-pay

## 2017-11-24 ENCOUNTER — Encounter (HOSPITAL_BASED_OUTPATIENT_CLINIC_OR_DEPARTMENT_OTHER): Payer: Self-pay

## 2017-11-24 ENCOUNTER — Ambulatory Visit (HOSPITAL_BASED_OUTPATIENT_CLINIC_OR_DEPARTMENT_OTHER): Payer: BC Managed Care – PPO | Admitting: Certified Registered"

## 2017-11-24 ENCOUNTER — Other Ambulatory Visit: Payer: Self-pay

## 2017-11-24 ENCOUNTER — Ambulatory Visit (HOSPITAL_BASED_OUTPATIENT_CLINIC_OR_DEPARTMENT_OTHER)
Admission: RE | Admit: 2017-11-24 | Discharge: 2017-11-24 | Disposition: A | Discharge status: Home / Self Care | Payer: BC Managed Care – PPO | Source: Ambulatory Visit | Attending: Orthopedic Surgery | Admitting: Orthopedic Surgery

## 2017-11-24 ENCOUNTER — Encounter (HOSPITAL_BASED_OUTPATIENT_CLINIC_OR_DEPARTMENT_OTHER)
Admission: RE | Disposition: A | Discharge status: Home / Self Care | Payer: Self-pay | Source: Ambulatory Visit | Attending: Orthopedic Surgery

## 2017-11-24 DIAGNOSIS — E039 Hypothyroidism, unspecified: Secondary | ICD-10-CM | POA: Diagnosis not present

## 2017-11-24 DIAGNOSIS — G5621 Lesion of ulnar nerve, right upper limb: Secondary | ICD-10-CM | POA: Insufficient documentation

## 2017-11-24 DIAGNOSIS — Z79890 Hormone replacement therapy (postmenopausal): Secondary | ICD-10-CM | POA: Insufficient documentation

## 2017-11-24 DIAGNOSIS — F17200 Nicotine dependence, unspecified, uncomplicated: Secondary | ICD-10-CM | POA: Diagnosis not present

## 2017-11-24 HISTORY — PX: ULNAR NERVE TRANSPOSITION: SHX2595

## 2017-11-24 HISTORY — DX: Hypothyroidism, unspecified: E03.9

## 2017-11-24 HISTORY — DX: Lesion of ulnar nerve, right upper limb: G56.21

## 2017-11-24 HISTORY — DX: Cardiac murmur, unspecified: R01.1

## 2017-11-24 SURGERY — ULNAR NERVE DECOMPRESSION/TRANSPOSITION
Anesthesia: General | Site: Elbow | Laterality: Right

## 2017-11-24 MED ORDER — FENTANYL CITRATE (PF) 100 MCG/2ML IJ SOLN
50.0000 ug | INTRAMUSCULAR | Status: DC | PRN
  Administered 2017-11-24: 50 ug via INTRAVENOUS

## 2017-11-24 MED ORDER — ROPIVACAINE HCL 7.5 MG/ML IJ SOLN
INTRAMUSCULAR | Status: DC | PRN
  Administered 2017-11-24: 20 mL via PERINEURAL

## 2017-11-24 MED ORDER — PROPOFOL 10 MG/ML IV BOLUS
INTRAVENOUS | Status: DC | PRN
  Administered 2017-11-24: 100 mg via INTRAVENOUS

## 2017-11-24 MED ORDER — HYDROCODONE-ACETAMINOPHEN 5-325 MG PO TABS: ORAL_TABLET | ORAL | 0 refills | Status: AC

## 2017-11-24 MED ORDER — LACTATED RINGERS IV SOLN
INTRAVENOUS | Status: DC
  Administered 2017-11-24: 12:00:00 via INTRAVENOUS

## 2017-11-24 MED ORDER — DEXAMETHASONE SODIUM PHOSPHATE 10 MG/ML IJ SOLN
INTRAMUSCULAR | Status: AC
  Filled 2017-11-24: qty 1

## 2017-11-24 MED ORDER — PROMETHAZINE HCL 25 MG/ML IJ SOLN: 6.2500 mg | INTRAMUSCULAR | Status: DC | PRN

## 2017-11-24 MED ORDER — MIDAZOLAM HCL 2 MG/2ML IJ SOLN
1.0000 mg | INTRAMUSCULAR | Status: DC | PRN
  Administered 2017-11-24: 1 mg via INTRAVENOUS

## 2017-11-24 MED ORDER — CEFAZOLIN SODIUM-DEXTROSE 2-4 GM/100ML-% IV SOLN: 2.0000 g | INTRAVENOUS | Status: DC

## 2017-11-24 MED ORDER — FENTANYL CITRATE (PF) 100 MCG/2ML IJ SOLN: 25.0000 ug | INTRAMUSCULAR | Status: DC | PRN

## 2017-11-24 MED ORDER — CHLORHEXIDINE GLUCONATE 4 % EX LIQD: 60.0000 mL | Freq: Once | CUTANEOUS | Status: DC

## 2017-11-24 MED ORDER — DEXAMETHASONE SODIUM PHOSPHATE 4 MG/ML IJ SOLN
INTRAMUSCULAR | Status: DC | PRN
  Administered 2017-11-24: 6 mg via INTRAVENOUS

## 2017-11-24 MED ORDER — ONDANSETRON HCL 4 MG/2ML IJ SOLN
INTRAMUSCULAR | Status: AC
  Filled 2017-11-24: qty 2

## 2017-11-24 MED ORDER — FENTANYL CITRATE (PF) 100 MCG/2ML IJ SOLN
INTRAMUSCULAR | Status: AC
  Filled 2017-11-24: qty 2

## 2017-11-24 MED ORDER — SCOPOLAMINE 1 MG/3DAYS TD PT72: 1.0000 | MEDICATED_PATCH | Freq: Once | TRANSDERMAL | Status: DC | PRN

## 2017-11-24 MED ORDER — MIDAZOLAM HCL 2 MG/2ML IJ SOLN
INTRAMUSCULAR | Status: AC
  Filled 2017-11-24: qty 2

## 2017-11-24 MED ORDER — CEFAZOLIN SODIUM-DEXTROSE 1-4 GM/50ML-% IV SOLN
1.0000 g | INTRAVENOUS | Status: AC
  Administered 2017-11-24: 1 g via INTRAVENOUS

## 2017-11-24 MED ORDER — LIDOCAINE 2% (20 MG/ML) 5 ML SYRINGE
INTRAMUSCULAR | Status: AC
  Filled 2017-11-24: qty 5

## 2017-11-24 MED ORDER — LIDOCAINE HCL (CARDIAC) PF 100 MG/5ML IV SOSY
PREFILLED_SYRINGE | INTRAVENOUS | Status: DC | PRN
  Administered 2017-11-24: 40 mg via INTRAVENOUS

## 2017-11-24 MED ORDER — CEFAZOLIN SODIUM-DEXTROSE 1-4 GM/50ML-% IV SOLN
INTRAVENOUS | Status: AC
  Filled 2017-11-24: qty 50

## 2017-11-24 MED ORDER — ONDANSETRON HCL 4 MG/2ML IJ SOLN
INTRAMUSCULAR | Status: DC | PRN
  Administered 2017-11-24: 4 mg via INTRAVENOUS

## 2017-11-24 SURGICAL SUPPLY — 62 items
APL SKNCLS STERI-STRIP NONHPOA (GAUZE/BANDAGES/DRESSINGS) ×2
BANDAGE ACE 3X5.8 VEL STRL LF (GAUZE/BANDAGES/DRESSINGS) ×5 IMPLANT
BANDAGE ACE 4X5 VEL STRL LF (GAUZE/BANDAGES/DRESSINGS) ×4 IMPLANT
BENZOIN TINCTURE PRP APPL 2/3 (GAUZE/BANDAGES/DRESSINGS) ×3 IMPLANT
BLADE MINI RND TIP GREEN BEAV (BLADE) IMPLANT
BLADE SURG 15 STRL LF DISP TIS (BLADE) ×4 IMPLANT
BLADE SURG 15 STRL SS (BLADE) ×8
BNDG CMPR 9X4 STRL LF SNTH (GAUZE/BANDAGES/DRESSINGS) ×2
BNDG ESMARK 4X9 LF (GAUZE/BANDAGES/DRESSINGS) ×4 IMPLANT
BNDG GAUZE ELAST 4 BULKY (GAUZE/BANDAGES/DRESSINGS) ×4 IMPLANT
CHLORAPREP W/TINT 26ML (MISCELLANEOUS) ×4 IMPLANT
CLOSURE WOUND 1/2 X4 (GAUZE/BANDAGES/DRESSINGS) ×1
CORD BIPOLAR FORCEPS 12FT (ELECTRODE) ×4 IMPLANT
COVER BACK TABLE 60X90IN (DRAPES) ×4 IMPLANT
COVER MAYO STAND STRL (DRAPES) ×4 IMPLANT
CUFF TOURN SGL LL 18 NRW (TOURNIQUET CUFF) ×4 IMPLANT
CUFF TOURNIQUET SINGLE 18IN (TOURNIQUET CUFF) IMPLANT
DECANTER SPIKE VIAL GLASS SM (MISCELLANEOUS) IMPLANT
DRAPE EXTREMITY T 121X128X90 (DRAPE) ×4 IMPLANT
DRAPE SURG 17X23 STRL (DRAPES) ×4 IMPLANT
DRSG PAD ABDOMINAL 8X10 ST (GAUZE/BANDAGES/DRESSINGS) ×4 IMPLANT
GAUZE 4X4 16PLY RFD (DISPOSABLE) IMPLANT
GAUZE SPONGE 4X4 12PLY STRL (GAUZE/BANDAGES/DRESSINGS) ×4 IMPLANT
GAUZE XEROFORM 1X8 LF (GAUZE/BANDAGES/DRESSINGS) ×4 IMPLANT
GLOVE BIO SURGEON STRL SZ7.5 (GLOVE) ×4 IMPLANT
GLOVE BIOGEL PI IND STRL 7.0 (GLOVE) ×2 IMPLANT
GLOVE BIOGEL PI IND STRL 8 (GLOVE) ×2 IMPLANT
GLOVE BIOGEL PI IND STRL 8.5 (GLOVE) ×2 IMPLANT
GLOVE BIOGEL PI INDICATOR 7.0 (GLOVE) ×4
GLOVE BIOGEL PI INDICATOR 8 (GLOVE) ×2
GLOVE BIOGEL PI INDICATOR 8.5 (GLOVE) ×2
GLOVE SURG ORTHO 8.0 STRL STRW (GLOVE) ×4 IMPLANT
GOWN STRL REUS W/ TWL LRG LVL3 (GOWN DISPOSABLE) ×2 IMPLANT
GOWN STRL REUS W/TWL LRG LVL3 (GOWN DISPOSABLE) ×4
GOWN STRL REUS W/TWL XL LVL3 (GOWN DISPOSABLE) ×8 IMPLANT
NDL HYPO 25X1 1.5 SAFETY (NEEDLE) IMPLANT
NEEDLE HYPO 25X1 1.5 SAFETY (NEEDLE) IMPLANT
NS IRRIG 1000ML POUR BTL (IV SOLUTION) ×4 IMPLANT
PACK BASIN DAY SURGERY FS (CUSTOM PROCEDURE TRAY) ×4 IMPLANT
PAD CAST 3X4 CTTN HI CHSV (CAST SUPPLIES) ×1 IMPLANT
PAD CAST 4YDX4 CTTN HI CHSV (CAST SUPPLIES) ×2 IMPLANT
PADDING CAST ABS 4INX4YD NS (CAST SUPPLIES) ×2
PADDING CAST ABS COTTON 4X4 ST (CAST SUPPLIES) ×2 IMPLANT
PADDING CAST COTTON 3X4 STRL (CAST SUPPLIES)
PADDING CAST COTTON 4X4 STRL (CAST SUPPLIES) ×4
SLEEVE SCD COMPRESS KNEE MED (MISCELLANEOUS) ×4 IMPLANT
SLING ARM FOAM STRAP MED (SOFTGOODS) ×3 IMPLANT
SPLINT FAST PLASTER 5X30 (CAST SUPPLIES)
SPLINT PLASTER CAST FAST 5X30 (CAST SUPPLIES) IMPLANT
SPLINT PLASTER CAST XFAST 3X15 (CAST SUPPLIES) IMPLANT
SPLINT PLASTER XTRA FASTSET 3X (CAST SUPPLIES)
STOCKINETTE 4X48 STRL (DRAPES) ×4 IMPLANT
STRIP CLOSURE SKIN 1/2X4 (GAUZE/BANDAGES/DRESSINGS) ×2 IMPLANT
SUT ETHILON 4 0 PS 2 18 (SUTURE) ×1 IMPLANT
SUT MON AB 4-0 PS1 27 (SUTURE) ×3 IMPLANT
SUT VIC AB 2-0 SH 27 (SUTURE) ×4
SUT VIC AB 2-0 SH 27XBRD (SUTURE) ×2 IMPLANT
SUT VICRYL 4-0 PS2 18IN ABS (SUTURE) IMPLANT
SYR BULB 3OZ (MISCELLANEOUS) ×4 IMPLANT
SYR CONTROL 10ML LL (SYRINGE) IMPLANT
TOWEL GREEN STERILE FF (TOWEL DISPOSABLE) ×4 IMPLANT
UNDERPAD 30X30 (UNDERPADS AND DIAPERS) ×4 IMPLANT

## 2017-11-24 NOTE — H&P (Signed)
  Jeanne Schmidt is an 37 y.o. female.   Chief Complaint: right ulnar neuropathy HPI: 37 yo female with numbness and tingling right ring and small fingers.  Positive nerve conduction studies.  She wishes to have right ulnar nerve decompression with possible transposition.   Allergies: No Known Allergies  Past Medical History:  Diagnosis Date  . Heart murmur    no known problems, per pt. - no cardiologist  . Hypothyroidism   . 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 CESAREAN SECTION  07/16/2000  . TOTAL THYROIDECTOMY  01/31/2008  . WISDOM TOOTH EXTRACTION      Family History: History reviewed. No pertinent family history.  Social History:   reports that she has been smoking cigarettes. She has smoked for the past 4.00 years. She has never used smokeless tobacco. She reports that she drinks alcohol. She reports that she does not use drugs.  Medications: No medications prior to admission.    No results found for this or any previous visit (from the past 48 hour(s)).  No results found.   A comprehensive review of systems was negative.  Height 5' (1.524 m), weight 37.6 kg, last menstrual period 10/27/2017.  General appearance: alert, cooperative and appears stated age Head: Normocephalic, without obvious abnormality, atraumatic Neck: supple, symmetrical, trachea midline Cardio: regular rate and rhythm Resp: clear to auscultation bilaterally Extremities: Intact sensation and capillary refill all digits except right ring and small fingers which have tingling.  +epl/fpl/io.  No wounds.  Pulses: 2+ and symmetric Skin: Skin color, texture, turgor normal. No rashes or lesions Neurologic: Grossly normal Incision/Wound: none  Assessment/Plan Right ulnar nerve compression.  Non operative and operative treatment options were discussed with the patient and patient wishes to proceed with operative treatment. Risks,  benefits, and alternatives of surgery were discussed and the patient agrees with the plan of care.   Anayla Giannetti R 11/24/2017, 9:43 AM

## 2017-11-24 NOTE — Anesthesia Preprocedure Evaluation (Signed)
Anesthesia Evaluation  Patient identified by MRN, date of birth, ID band Patient awake    Reviewed: Allergy & Precautions, NPO status , Patient's Chart, lab work & pertinent test results  Airway Mallampati: II  TM Distance: >3 FB Neck ROM: Full    Dental  (+) Teeth Intact, Dental Advisory Given   Pulmonary Current Smoker,    Pulmonary exam normal breath sounds clear to auscultation       Cardiovascular negative cardio ROS Normal cardiovascular exam Rhythm:Regular Rate:Normal     Neuro/Psych  Neuromuscular disease    GI/Hepatic negative GI ROS, Neg liver ROS,   Endo/Other  Hypothyroidism   Renal/GU negative Renal ROS     Musculoskeletal negative musculoskeletal ROS (+)   Abdominal   Peds  Hematology negative hematology ROS (+)   Anesthesia Other Findings Day of surgery medications reviewed with the patient.  Reproductive/Obstetrics                            Anesthesia Physical Anesthesia Plan  ASA: II  Anesthesia Plan: General   Post-op Pain Management:  Regional for Post-op pain   Induction: Intravenous  PONV Risk Score and Plan: 2 and Ondansetron, Midazolam and Dexamethasone  Airway Management Planned: LMA  Additional Equipment:   Intra-op Plan:   Post-operative Plan: Extubation in OR  Informed Consent: I have reviewed the patients History and Physical, chart, labs and discussed the procedure including the risks, benefits and alternatives for the proposed anesthesia with the patient or authorized representative who has indicated his/her understanding and acceptance.   Dental advisory given  Plan Discussed with: CRNA  Anesthesia Plan Comments:         Anesthesia Quick Evaluation

## 2017-11-24 NOTE — Progress Notes (Signed)
Assisted Dr. Turk with right, ultrasound guided, supraclavicular block. Side rails up, monitors on throughout procedure. See vital signs in flow sheet. Tolerated Procedure well. 

## 2017-11-24 NOTE — Anesthesia Procedure Notes (Signed)
Procedure Name: LMA Insertion Date/Time: 11/24/2017 12:28 PM Performed by: Gar GibbonKeeton, Seline Enzor S, CRNA Pre-anesthesia Checklist: Patient identified, Emergency Drugs available, Suction available and Patient being monitored Patient Re-evaluated:Patient Re-evaluated prior to induction Oxygen Delivery Method: Circle system utilized Preoxygenation: Pre-oxygenation with 100% oxygen Induction Type: IV induction Ventilation: Mask ventilation without difficulty LMA: LMA inserted LMA Size: 3.0 Number of attempts: 1 Airway Equipment and Method: Bite block Placement Confirmation: positive ETCO2 Tube secured with: Tape Dental Injury: Teeth and Oropharynx as per pre-operative assessment

## 2017-11-24 NOTE — Anesthesia Procedure Notes (Signed)
Anesthesia Regional Block: Supraclavicular block   Pre-Anesthetic Checklist: ,, timeout performed, Correct Patient, Correct Site, Correct Laterality, Correct Procedure, Correct Position, site marked, Risks and benefits discussed,  Surgical consent,  Pre-op evaluation,  At surgeon's request and post-op pain management  Laterality: Right  Prep: chloraprep       Needles:  Injection technique: Single-shot  Needle Type: Echogenic Needle     Needle Length: 9cm  Needle Gauge: 21     Additional Needles:   Procedures:,,,, ultrasound used (permanent image in chart),,,,  Narrative:  Start time: 11/24/2017 12:05 PM End time: 11/24/2017 12:10 PM Injection made incrementally with aspirations every 5 mL.  Performed by: Personally  Anesthesiologist: Cecile Hearingurk, Cristiana Yochim Edward, MD  Additional Notes: No pain on injection. No increased resistance to injection. Injection made in 5cc increments.  Good needle visualization.  Patient tolerated procedure well.

## 2017-11-24 NOTE — Op Note (Signed)
NAME: Jeanne Schmidt MEDICAL RECORD NO: [id number] DATE OF BIRTH: December 12, 1980 [facility]: [facility] PHYSICIAN: Tami RibasKEVIN R. 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   ASSISTANT: Cindee SaltGary Acacia Latorre, MD   ANESTHESIA:  General with regional   INTRAVENOUS FLUIDS:  Per anesthesia flow sheet.   ESTIMATED BLOOD LOSS:  Minimal.   COMPLICATIONS:  None.   SPECIMENS:  none   TOURNIQUET TIME:    Total Tourniquet Time Documented: Upper Arm (Right) - 23 minutes Total: Upper Arm (Right) - 23 minutes    DISPOSITION:  Stable to PACU.   INDICATIONS: [age] year old female with numbness and tingling of the right ring and small fingers.  She has positive nerve conduction studies.  She wishes to have a right ulnar nerve decompression. Risks, benefits and alternatives of surgery were discussed including the risks of blood loss, infection, damage to nerves, vessels, tendons, ligaments, bone for surgery, need for additional surgery, complications with wound healing, continued pain, nonunion, malunion, stiffness.  She voiced understanding of these risks and elected to proceed.  OPERATIVE COURSE:  After being identified preoperatively by myself,  the patient and I agreed on the procedure and site of the procedure.  The surgical site was marked.  Surgical consent had been signed. She was given IV Ancef as preoperative antibiotic prophylaxis. She was transferred to the operating room and placed on the operating table in supine position with the Right upper extremity on an arm board.  General anesthesia was induced by the anesthesiologist. A regional block had been performed by anesthesia in preoperative holding.   Right upper extremity was  prepped and draped in normal sterile orthopedic fashion.  A surgical pause was performed between the surgeons, anesthesia, and operating room staff and all were in agreement as to the patient, procedure, and site of procedure.  Tourniquet at the proximal aspect of the extremity was inflated to 250 mmHg after exsanguination of the arm with an Esmarch bandage.    Incision was made at the medial side of the elbow and carried into the subcutaneous tissues by spreading technique.  Bipolar electric cautery was used to obtain hemostasis.  The ulnar nerve was identified.  Was decompressed distally.  The 2 heads of the FCU were spread.  The nerve branch of the FCU was identified and protected.  The investing fascia over the nerve was released.  A finger was placed into the wound to ensure complete decompression which was the case.  The nerve was then decompressed proximally.  Osborne's ligament was thin and flimsy.  There is a small anconeus epitrochlear areas.  The fascia was divided proximally including the investing fascia over the nerve.  Finger was placed into the wound to ensure complete decompression which was the case.  There were no remaining sites of compression noted.  The elbow was placed into full flexion.  The nerve did not subluxate out of the groove.  The wound was copiously irrigated with sterile saline.  The anterior flap of Osborne's ligament was sewn to the posterior subtenons tissues to provide a bolster.  This was inspected and there was no compression over the nerve.  4-0 Vicryl suture was used in an inverted interrupted  fashion this obtains tissues and the skin was closed with a running subcuticular 5-0 Monocryl suture.  This was augmented with benzoin and Steri-Strips.  The wound was dressed with sterile 4 x 4's and wrapped with a Kerlix and Ace bandage.  The tourniquet was deflated at 23 minutes.  Fingertips were pink with brisk capillary refill after deflation of tourniquet.  The operative  drapes  were broken down.  The patient was awoken from anesthesia safely.  She was transferred back to the stretcher and taken to PACU in stable condition.  I will see her back in the office in 1 week for postoperative followup.  I will give her a prescription for Norco 5/325 1-2 tabs PO q6 hours prn pain, dispense # 20.   Tami Ribas, MD Electronically signed, 11/24/17

## 2017-11-24 NOTE — Transfer of Care (Signed)
Immediate Anesthesia Transfer of Care Note  Patient: Jeanne Schmidt  Procedure(s) Performed: Decompression right ulnar nerve (Right Elbow)  Patient Location: PACU  Anesthesia Type:GA combined with regional for post-op pain  Level of Consciousness: awake, sedated and patient cooperative  Airway & Oxygen Therapy: Patient Spontanous Breathing and Patient connected to face mask oxygen  Post-op Assessment: Report given to RN and Post -op Vital signs reviewed and stable  Post vital signs: Reviewed and stable  Last Vitals:  Vitals Value Taken Time  BP    Temp    Pulse 119 11/24/2017  1:13 PM  Resp 19 11/24/2017  1:13 PM  SpO2 99 % 11/24/2017  1:13 PM  Vitals shown include unvalidated device data.  Last Pain:  Vitals:   11/24/17 1154  TempSrc: Oral  PainSc:          Complications: No apparent anesthesia complications

## 2017-11-24 NOTE — Op Note (Signed)
I assisted Surgeon(s) and Role:    * Betha LoaKuzma, Kevin, MD - Primary    Cindee Salt* Barbee Mamula, MD - Assisting on the Procedure(s): Decompression right ulnar nerve on 11/24/2017.  I provided assistance on this case as follows: setup, approach, retraction, decompression of the nerve, closure of the wound and application of the dressings.  Electronically signed by: Nicki ReaperKUZMA,Shunte Senseney R, MD Date: 11/24/2017 Time: 1:09 PM

## 2017-11-24 NOTE — Discharge Instructions (Addendum)

## 2017-11-25 ENCOUNTER — Encounter (HOSPITAL_BASED_OUTPATIENT_CLINIC_OR_DEPARTMENT_OTHER): Payer: Self-pay | Admitting: Orthopedic Surgery

## 2017-11-25 NOTE — Anesthesia Postprocedure Evaluation (Signed)
Anesthesia Post Note  Patient: Jeanne Schmidt  Procedure(s) Performed: Decompression right ulnar nerve (Right Elbow)     Patient location during evaluation: PACU Anesthesia Type: General Level of consciousness: awake and alert, oriented and awake Pain management: pain level controlled Vital Signs Assessment: post-procedure vital signs reviewed and stable Respiratory status: spontaneous breathing, nonlabored ventilation and respiratory function stable Cardiovascular status: blood pressure returned to baseline and stable Postop Assessment: no apparent nausea or vomiting Anesthetic complications: no    Last Vitals:  Vitals:   11/24/17 1345 11/24/17 1411  BP: 107/69 132/75  Pulse: 78 72  Resp: 17 18  Temp:  37.4 C  SpO2: 100% 100%    Last Pain:  Vitals:   11/24/17 1411  TempSrc:   PainSc: 0-No pain                 Cecile HearingStephen Edward Turk
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
## 2024-04-14 ENCOUNTER — Ambulatory Visit
Admission: EM | Admit: 2024-04-14 | Discharge: 2024-04-14 | Disposition: A | Discharge status: Home / Self Care | Payer: Self-pay | Attending: Family Medicine | Admitting: Family Medicine

## 2024-04-14 DIAGNOSIS — J988 Other specified respiratory disorders: Secondary | ICD-10-CM

## 2024-04-14 DIAGNOSIS — R509 Fever, unspecified: Secondary | ICD-10-CM

## 2024-04-14 DIAGNOSIS — B9789 Other viral agents as the cause of diseases classified elsewhere: Secondary | ICD-10-CM

## 2024-04-14 LAB — POCT INFLUENZA A/B
Influenza A, POC: NEGATIVE
Influenza B, POC: NEGATIVE

## 2024-04-14 MED ORDER — PSEUDOEPHEDRINE HCL 30 MG PO TABS: 30.0000 mg | ORAL_TABLET | Freq: Three times a day (TID) | ORAL | 0 refills | Status: AC | PRN

## 2024-04-14 MED ORDER — CETIRIZINE HCL 10 MG PO TABS: 10.0000 mg | ORAL_TABLET | Freq: Every day | ORAL | 0 refills | Status: AC

## 2024-04-14 MED ORDER — IBUPROFEN 600 MG PO TABS: 600.0000 mg | ORAL_TABLET | Freq: Four times a day (QID) | ORAL | 0 refills | Status: AC | PRN

## 2024-04-14 MED ORDER — PROMETHAZINE-DM 6.25-15 MG/5ML PO SYRP: 5.0000 mL | ORAL_SOLUTION | Freq: Three times a day (TID) | ORAL | 0 refills | Status: AC | PRN

## 2024-04-14 NOTE — ED Provider Notes (Signed)
 " [facility]  Note:  This document was prepared using Conservation officer, historic buildings and may include unintentional dictation errors.  MRN: [id number] DOB: 11-06-1980  Subjective:   Jeanne Schmidt is a 44 y.o. female presenting for 3 day history of acute sneezing, post-nasal drainage, left eye irritation, sinus headaches, fevers (highest was 102F) last night and has improved today. No chest pain, shob, wheezing. No asthma. Smokes cigars.   Current Outpatient Medications  Medication Instructions   HYDROcodone -acetaminophen  (NORCO) 5-325 MG tablet 1-2 tabs po q6 hours prn pain   levothyroxine (SYNTHROID) 88 mcg, Oral, Daily   naproxen  sodium (ALEVE ) 220 mg   Pseudoeph-Doxylamine-DM-APAP (NYQUIL PO) Take by mouth.    Allergies[1]  Past Medical History:  Diagnosis Date   Heart murmur    no known problems, per pt. 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 No pertinent family history.  Social History   Occupational History   Not on file  Tobacco Use   Smoking status: Some Days    Types: Cigarettes   Smokeless tobacco: Never   Tobacco comments:    2 cig./week  Vaping Use   Vaping status: Never Used  Substance and Sexual Activity   Alcohol use: Yes    Comment: beer 2-3 times a week   Drug use: Never   Sexual activity: Yes     ROS   Objective:   Vitals: BP 115/76 (BP Location: Left Arm)   Pulse 82   Temp 99.1 F (37.3 C) (Oral)   Resp 16   LMP  (Within Months) Comment: 1 month  SpO2 98%   Physical Exam Constitutional:       General: She is not in acute distress.    Appearance: Normal appearance. She is well-developed and normal weight. She is not ill-appearing, toxic-appearing or diaphoretic.  HENT:     Head: Normocephalic and atraumatic.     Right Ear: Tympanic membrane, ear canal and external ear normal. No drainage or tenderness. No middle ear effusion. There is no impacted cerumen. Tympanic membrane is not erythematous or bulging.     Left Ear: Tympanic membrane, ear canal and external ear normal. No drainage or tenderness.  No middle ear effusion. There is no impacted cerumen. Tympanic membrane is not erythematous or bulging.     Nose: Nose normal. No congestion or rhinorrhea.     Mouth/Throat:     Mouth: Mucous membranes are moist. No oral lesions.     Pharynx: No pharyngeal swelling, oropharyngeal exudate, posterior oropharyngeal erythema or uvula swelling.     Tonsils: No tonsillar exudate or tonsillar abscesses. 0 on the right. 0 on the left.  Eyes:     General: No scleral icterus.       Right eye: No discharge.        Left eye: No discharge.     Extraocular Movements: Extraocular movements intact.     Right eye: Normal extraocular motion.     Left eye: Normal extraocular motion.     Conjunctiva/sclera: Conjunctivae normal.  Cardiovascular:  Rate and Rhythm: Normal rate and regular rhythm.     Heart sounds: Normal heart sounds. No murmur heard.    No friction rub. No gallop.  Pulmonary:     Effort: Pulmonary effort is normal. No respiratory distress.     Breath sounds: No stridor. No wheezing, rhonchi or rales.  Chest:     Chest wall: No tenderness.  Musculoskeletal:     Cervical back: Normal range of motion and neck supple.  Lymphadenopathy:     Cervical: No cervical adenopathy.  Skin:    General: Skin is warm and dry.  Neurological:     General: No focal deficit present.     Mental Status: She is alert and oriented to person, place, and time.  Psychiatric:        Mood and Affect: Mood  normal.        Behavior: Behavior normal.     Results for orders placed or performed during the hospital encounter of 04/14/24 (from the past 24 hours)  POCT Influenza A/B     Status: Normal   Collection Time: 04/14/24 10:31 AM  Result Value Ref Range   Influenza A, POC Negative Negative   Influenza B, POC Negative Negative    Assessment and Plan :   PDMP not reviewed this encounter.  1. Viral respiratory infection   2. Fever, unspecified      Deferred imaging given clear pulmonary exam. Suspect viral URI, viral syndrome. Physical exam findings reassuring and vital signs stable for discharge. Advised supportive care, offered symptomatic relief. Counseled patient on potential for adverse effects with medications prescribed/recommended today, ER and return-to-clinic precautions discussed, patient verbalized understanding.      [1] No Known Allergies    Christopher Savannah, PA-C 04/14/24 1032  "

## 2024-04-14 NOTE — ED Triage Notes (Signed)
 Pt reports fever 102.0 F, cough, left eye irritation, headache, sneezing, postnasal drip, mucus x 3 days. Nyquil, Aleve  gives some relief.

## 2024-04-14 NOTE — Discharge Instructions (Addendum)
 We will manage this as a viral respiratory infection/illness. For sore throat or cough try using a honey-based tea. Use 3 teaspoons of honey with juice squeezed from half lemon. Place shaved pieces of ginger into 1/2-1 cup of water and warm over stove top. Then mix the ingredients and repeat every 4 hours as needed. Please take ibuprofen  600mg  every 6 hours with food alternating with OR taken together with Tylenol 500mg -650mg  every 6 hours for throat pain, fevers, aches and pains. Hydrate very well with at least 2 liters of water. Eat light meals such as soups (chicken and noodles, vegetable, chicken and wild rice).  Do not eat foods that you are allergic to.  Taking an antihistamine like Zyrtec (10mg  daily) can help against postnasal drainage, sinus congestion which can cause sinus pain, sinus headaches, throat pain, painful swallowing, coughing.  You can take this together with pseudoephedrine (Sudafed) at a dose of 30 mg 3 times a day or twice daily as needed for the same kind of nasal drip, congestion.
